# Patient Record
Sex: Female | Born: 1955 | Race: White | Hispanic: No | Marital: Single | Smoking: Never smoker
Health system: Southern US, Community
[De-identification: ages and names within clinical notes are randomized; demographics above are authoritative.]

---

## 2005-01-13 ENCOUNTER — Other Ambulatory Visit: Admission: RE | Admit: 2005-01-13 | Discharge: 2005-01-13 | Payer: Self-pay | Admitting: Family Medicine

## 2005-03-01 ENCOUNTER — Ambulatory Visit (HOSPITAL_COMMUNITY): Admission: RE | Admit: 2005-03-01 | Discharge: 2005-03-01 | Payer: Self-pay | Admitting: Gastroenterology

## 2005-03-01 ENCOUNTER — Encounter (INDEPENDENT_AMBULATORY_CARE_PROVIDER_SITE_OTHER): Payer: Self-pay | Admitting: Specialist

## 2006-03-01 ENCOUNTER — Other Ambulatory Visit: Admission: RE | Admit: 2006-03-01 | Discharge: 2006-03-01 | Payer: Self-pay | Admitting: Family Medicine

## 2008-06-24 ENCOUNTER — Other Ambulatory Visit: Admission: RE | Admit: 2008-06-24 | Discharge: 2008-06-24 | Payer: Self-pay | Admitting: Family Medicine

## 2008-12-30 ENCOUNTER — Other Ambulatory Visit: Admission: RE | Admit: 2008-12-30 | Discharge: 2008-12-30 | Payer: Self-pay | Admitting: Family Medicine

## 2010-06-17 ENCOUNTER — Encounter: Admission: RE | Admit: 2010-06-17 | Discharge: 2010-06-17 | Payer: Self-pay | Admitting: Family Medicine

## 2011-02-26 NOTE — Op Note (Signed)
NAMEASPIN, PALOMAREZ NO.:  [id number]   MEDICAL RECORD NO.:  [id number]          PATIENT TYPE:  AMB   LOCATION:  ENDO                         FACILITY:  [facility]   PHYSICIAN:  Graylin Shiver, M.D.   DATE OF BIRTH:  August 24, 1956   DATE OF PROCEDURE:  03/01/2005  DATE OF DISCHARGE:                                 OPERATIVE REPORT   PROCEDURES:  Colonoscopy with biopsy.   INDICATION:  Rectal bleeding and family history of colon polyps.   Informed consent was obtained after the explanation of the risks of  bleeding, infection and perforation.   PREMEDICATIONS:  Fentanyl 100 mcg IV and Versed 10 mg IV.   DESCRIPTION OF PROCEDURE:  With the patient in the left lateral decubitus  position, a rectal exam was performed.  No masses were felt.  The Olympus  colonoscope was inserted into the rectum and advanced around the colon to  the cecum.  Cecal landmarks were identified.  The cecum looked normal.  In  the ascending colon there was a small 3 mm sessile polyp biopsied off with  cold forceps.  The transverse colon looked normal.  The descending colon,  sigmoid and rectum looked normal.  There were some small internal  hemorrhoids.  She tolerated the procedure well without complications.   IMPRESSION:  1.  Small ascending colon polyp.  2.  Small internal hemorrhoids.   PLAN:  The pathology will be checked.      SFG/MEDQ  D:  03/01/2005  T:  03/01/2005  Job:  [id number]   cc:   Holley Bouche, M.D.  [address]  [place], [place] [address]  Fax: [phone]

## 2011-05-21 ENCOUNTER — Other Ambulatory Visit: Payer: Self-pay | Admitting: Family Medicine

## 2011-05-21 DIAGNOSIS — M542 Cervicalgia: Secondary | ICD-10-CM

## 2011-05-22 ENCOUNTER — Ambulatory Visit
Admission: RE | Admit: 2011-05-22 | Discharge: 2011-05-22 | Disposition: A | Discharge status: Home / Self Care | Payer: 59 | Source: Ambulatory Visit | Attending: Family Medicine | Admitting: Family Medicine

## 2011-05-22 DIAGNOSIS — M542 Cervicalgia: Secondary | ICD-10-CM

## 2011-06-04 ENCOUNTER — Encounter (HOSPITAL_COMMUNITY)
Admission: RE | Admit: 2011-06-04 | Discharge: 2011-06-04 | Disposition: A | Discharge status: Home / Self Care | Payer: 59 | Source: Ambulatory Visit | Attending: Neurological Surgery | Admitting: Neurological Surgery

## 2011-06-04 ENCOUNTER — Ambulatory Visit (HOSPITAL_COMMUNITY)
Admission: RE | Admit: 2011-06-04 | Discharge: 2011-06-04 | Disposition: A | Discharge status: Home / Self Care | Payer: 59 | Source: Ambulatory Visit | Attending: Neurological Surgery | Admitting: Neurological Surgery

## 2011-06-04 ENCOUNTER — Other Ambulatory Visit (HOSPITAL_COMMUNITY): Payer: Self-pay | Admitting: Neurological Surgery

## 2011-06-04 DIAGNOSIS — M5000 Cervical disc disorder with myelopathy, unspecified cervical region: Secondary | ICD-10-CM

## 2011-06-04 DIAGNOSIS — I10 Essential (primary) hypertension: Secondary | ICD-10-CM | POA: Insufficient documentation

## 2011-06-04 DIAGNOSIS — Z01812 Encounter for preprocedural laboratory examination: Secondary | ICD-10-CM | POA: Insufficient documentation

## 2011-06-04 DIAGNOSIS — M4802 Spinal stenosis, cervical region: Secondary | ICD-10-CM | POA: Insufficient documentation

## 2011-06-04 DIAGNOSIS — Z01810 Encounter for preprocedural cardiovascular examination: Secondary | ICD-10-CM | POA: Insufficient documentation

## 2011-06-04 DIAGNOSIS — Z01818 Encounter for other preprocedural examination: Secondary | ICD-10-CM | POA: Insufficient documentation

## 2011-06-04 LAB — BASIC METABOLIC PANEL
BUN: 16 mg/dL (ref 6–23)
CO2: 27 mEq/L (ref 19–32)
Calcium: 9.8 mg/dL (ref 8.4–10.5)
Chloride: 107 mEq/L (ref 96–112)
Creatinine, Ser: 0.85 mg/dL (ref 0.50–1.10)
GFR calc Af Amer: >60 mL/min (ref >60)
GFR calc non Af Amer: >60 mL/min (ref >60)
Glucose, Bld: 145 mg/dL — ABNORMAL HIGH (ref 70–99)
Potassium: 5.5 mEq/L — ABNORMAL HIGH (ref 3.5–5.1)
Sodium: 142 mEq/L (ref 135–145)

## 2011-06-04 LAB — SURGICAL PCR SCREEN
MRSA, PCR: NEGATIVE
Staphylococcus aureus: NEGATIVE

## 2011-06-04 LAB — CBC
HCT: 42.7 % (ref 36.0–46.0)
Hemoglobin: 14.3 g/dL (ref 12.0–15.0)
MCH: 29.4 pg (ref 26.0–34.0)
MCHC: 33.5 g/dL (ref 30.0–36.0)
MCV: 87.9 fL (ref 78.0–100.0)
Platelets: 213 10*3/uL (ref 150–400)
RBC: 4.86 MIL/uL (ref 3.87–5.11)
RDW: 14 % (ref 11.5–15.5)
WBC: 10.7 10*3/uL — ABNORMAL HIGH (ref 4.0–10.5)

## 2011-06-08 ENCOUNTER — Ambulatory Visit (HOSPITAL_COMMUNITY)
Admission: RE | Admit: 2011-06-08 | Discharge: 2011-06-09 | Disposition: A | Discharge status: Home / Self Care | Payer: 59 | Source: Ambulatory Visit | Attending: Neurological Surgery | Admitting: Neurological Surgery

## 2011-06-08 ENCOUNTER — Ambulatory Visit (HOSPITAL_COMMUNITY): Payer: 59

## 2011-06-08 DIAGNOSIS — Z01818 Encounter for other preprocedural examination: Secondary | ICD-10-CM | POA: Insufficient documentation

## 2011-06-08 DIAGNOSIS — M26609 Unspecified temporomandibular joint disorder, unspecified side: Secondary | ICD-10-CM | POA: Insufficient documentation

## 2011-06-08 DIAGNOSIS — Z01812 Encounter for preprocedural laboratory examination: Secondary | ICD-10-CM | POA: Insufficient documentation

## 2011-06-08 DIAGNOSIS — I10 Essential (primary) hypertension: Secondary | ICD-10-CM | POA: Insufficient documentation

## 2011-06-08 DIAGNOSIS — M5000 Cervical disc disorder with myelopathy, unspecified cervical region: Secondary | ICD-10-CM | POA: Insufficient documentation

## 2011-06-08 DIAGNOSIS — Z01810 Encounter for preprocedural cardiovascular examination: Secondary | ICD-10-CM | POA: Insufficient documentation

## 2011-06-08 LAB — BASIC METABOLIC PANEL
BUN: 9 mg/dL (ref 6–23)
CO2: 24 mEq/L (ref 19–32)
Calcium: 9.2 mg/dL (ref 8.4–10.5)
Chloride: 106 mEq/L (ref 96–112)
Creatinine, Ser: 0.72 mg/dL (ref 0.50–1.10)
GFR calc Af Amer: >60 mL/min (ref >60)
GFR calc non Af Amer: >60 mL/min (ref >60)
Glucose, Bld: 113 mg/dL — ABNORMAL HIGH (ref 70–99)
Potassium: 4 mEq/L (ref 3.5–5.1)
Sodium: 140 mEq/L (ref 135–145)

## 2011-06-17 NOTE — Op Note (Signed)
Sheri Adams, Sheri Adams NO.:  [id number]  MEDICAL RECORD NO.:  [id number]  LOCATION:  3528                         FACILITY:  [facility]  PHYSICIAN:  Stefani Dama, M.D.  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  PROCEDURE:  Anterior cervical decompression and arthrodesis with structural allograft C5-6 and C6-7, anterior plate fixation with Alphatec plate using a new style Trestle plate.  SURGEON:  Stefani Dama, MD  FIRST ASSISTANT:  Hilda Lias, MD  ANESTHESIA:  General endotracheal.  INDICATIONS:  Sheri Adams is a 55 year old individual who has had neck pain, shoulder and arm pain with weakness in the right upper extremity.  She has evidence of C6 and C7 radicular changes on the right side and she was advised to have an MRI which demonstrated presence of cord compression and severe spondylitic disease at C5-C6.  The patient was taken to the operating room at this time to undergo surgical decompression of the 2 levels involved C5-6 and C6-7 and she has had significant weakness in the right arm.  The patient was brought to the operating supine on the stretcher.  After smooth induction of general endotracheal anesthesia, she was placed in a horseshoe headrest and neck was prepped with alcohol and DuraPrep and draped in sterile fashion.  A transverse incision was created on the left side of the neck and this was carried down to the platysma.  The plane between the sternocleidomastoid and strap muscles were dissected bluntly until the first prevertebral space was reached.  This was identified positively at C5-C6  on localizing radiograph.  The longus colli muscle on either side of the area between C5-6 and C6-7 was then stripped and a self-retaining Caspar retractor was placed in the wound. C5-6 was first opened with 15 blade, used to open the ventral aspect of the disk space and the interdiskal space was cleaned of significant quantity of severely degenerating desiccated disk material.  There was a moderate amount of uncinate hypertrophy that was encountered and this was taken down with a high-speed drill.  Osteophytectomy of the uncinate spurs was then performed and this opened the C6 nerve root foramen and osteophyte from the inferior margin of body of C5 was similarly drilled down and the posterior longitudinal ligament was opened with a 2-mm Kerrison punch relieving significantly the central canal stenosis. Lateral recesses were then opened in a similar fashion removing the posterior longitudinal ligament far out into the lateral recesses.  Once an adequate decompression was achieved,  the endplates were smoothed with a 4-mm barrel bit.  A 7-mm transgraft was then shaved to the appropriate size and configuration, filled with demineralized bone matrix, and placed into the interspace in appropriate depth.  Then, attention was turned to C6-C7 where similar procedure was carried out. Here, the central canal was thickened but not nearly as thick as it was at C5-C6.  The central canal and lateral recesses were decompressed in a similar fashion.  Osteophytosis was encountered that was not nearly as severe as it was at C5-C6.  Once the nerve roots were well decompressed, hemostasis was established.  Another 7-mm transgraft was shaved to the appropriate size and configuration. Filled with demineralized bone matrix, and placed into the interspace.  At this point, a 32-mm standard size Alphatec plate of the Trestle variety was placed on the ventral aspect of the vertebral bodies and secured with 6  locking 4 x 12-mm screws.  After this was accomplished, the area was irrigated copiously with antibiotic-irrigating solution.  Hemostasis was meticulously obtained in this area and the platysma was closed with 3-0 Vicryl in inverted interrupted fashion, 3-0 Vicryl using subcuticular tissues, and Dermabond was placed on the skin.  Final radiograph that was obtained prior to final closure revealed good position of the surgical construct.     Stefani Dama, M.D.     [name]  D:  06/08/2011  T:  06/08/2011  Job:  [id number]  Electronically Signed by Barnett Abu M.D. on 06/17/2011 03:16:59 PM

## 2012-01-24 ENCOUNTER — Other Ambulatory Visit: Payer: Self-pay | Admitting: Family Medicine

## 2012-01-24 ENCOUNTER — Other Ambulatory Visit (HOSPITAL_COMMUNITY)
Admission: RE | Admit: 2012-01-24 | Discharge: 2012-01-24 | Disposition: A | Discharge status: Home / Self Care | Payer: 59 | Source: Ambulatory Visit | Attending: Family Medicine | Admitting: Family Medicine

## 2012-01-24 DIAGNOSIS — Z0000 Encounter for general adult medical examination without abnormal findings: Secondary | ICD-10-CM | POA: Insufficient documentation

## 2012-09-21 IMAGING — CR DG CHEST 2V
2 series · 2 of 2 positions shown · non-contrast
Comparison: None.

CLINICAL DATA: Preop for cervical spine laminectomy, hypertension

CHEST - 2 VIEW

[view not recorded (1 of 2)]
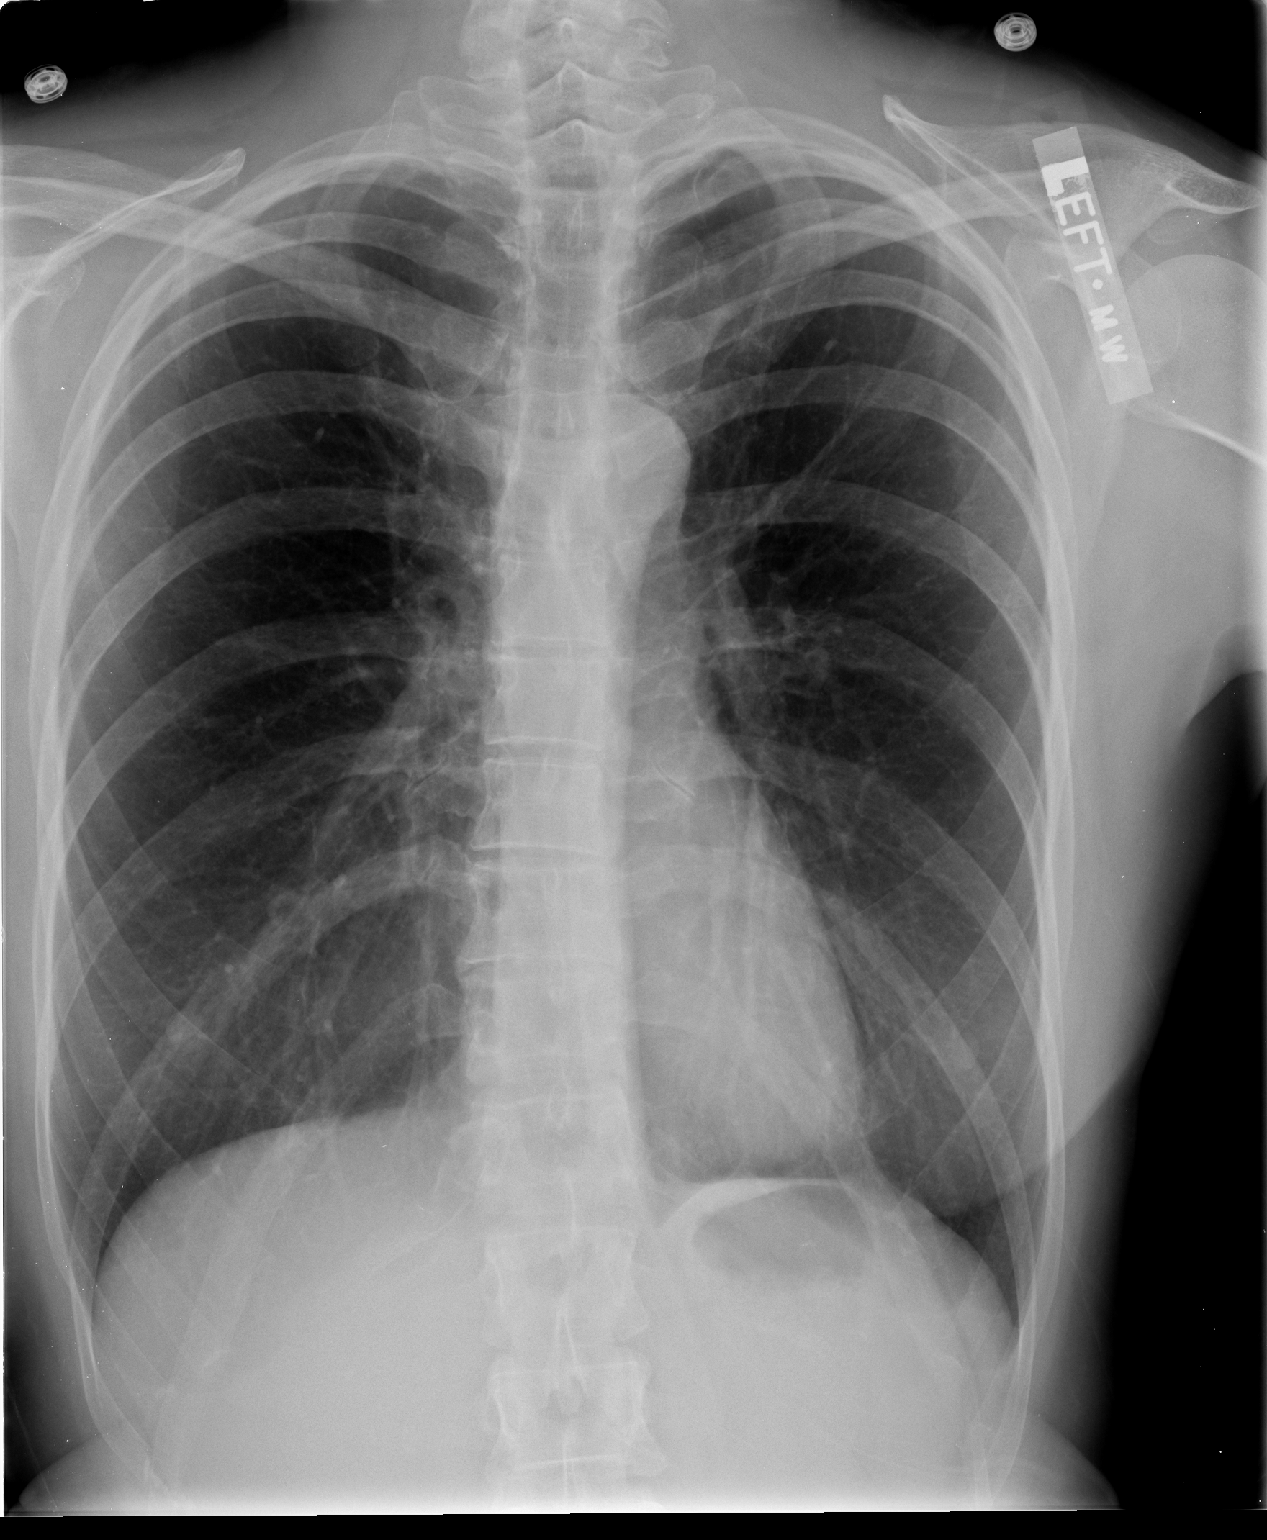

[view not recorded (2 of 2)]
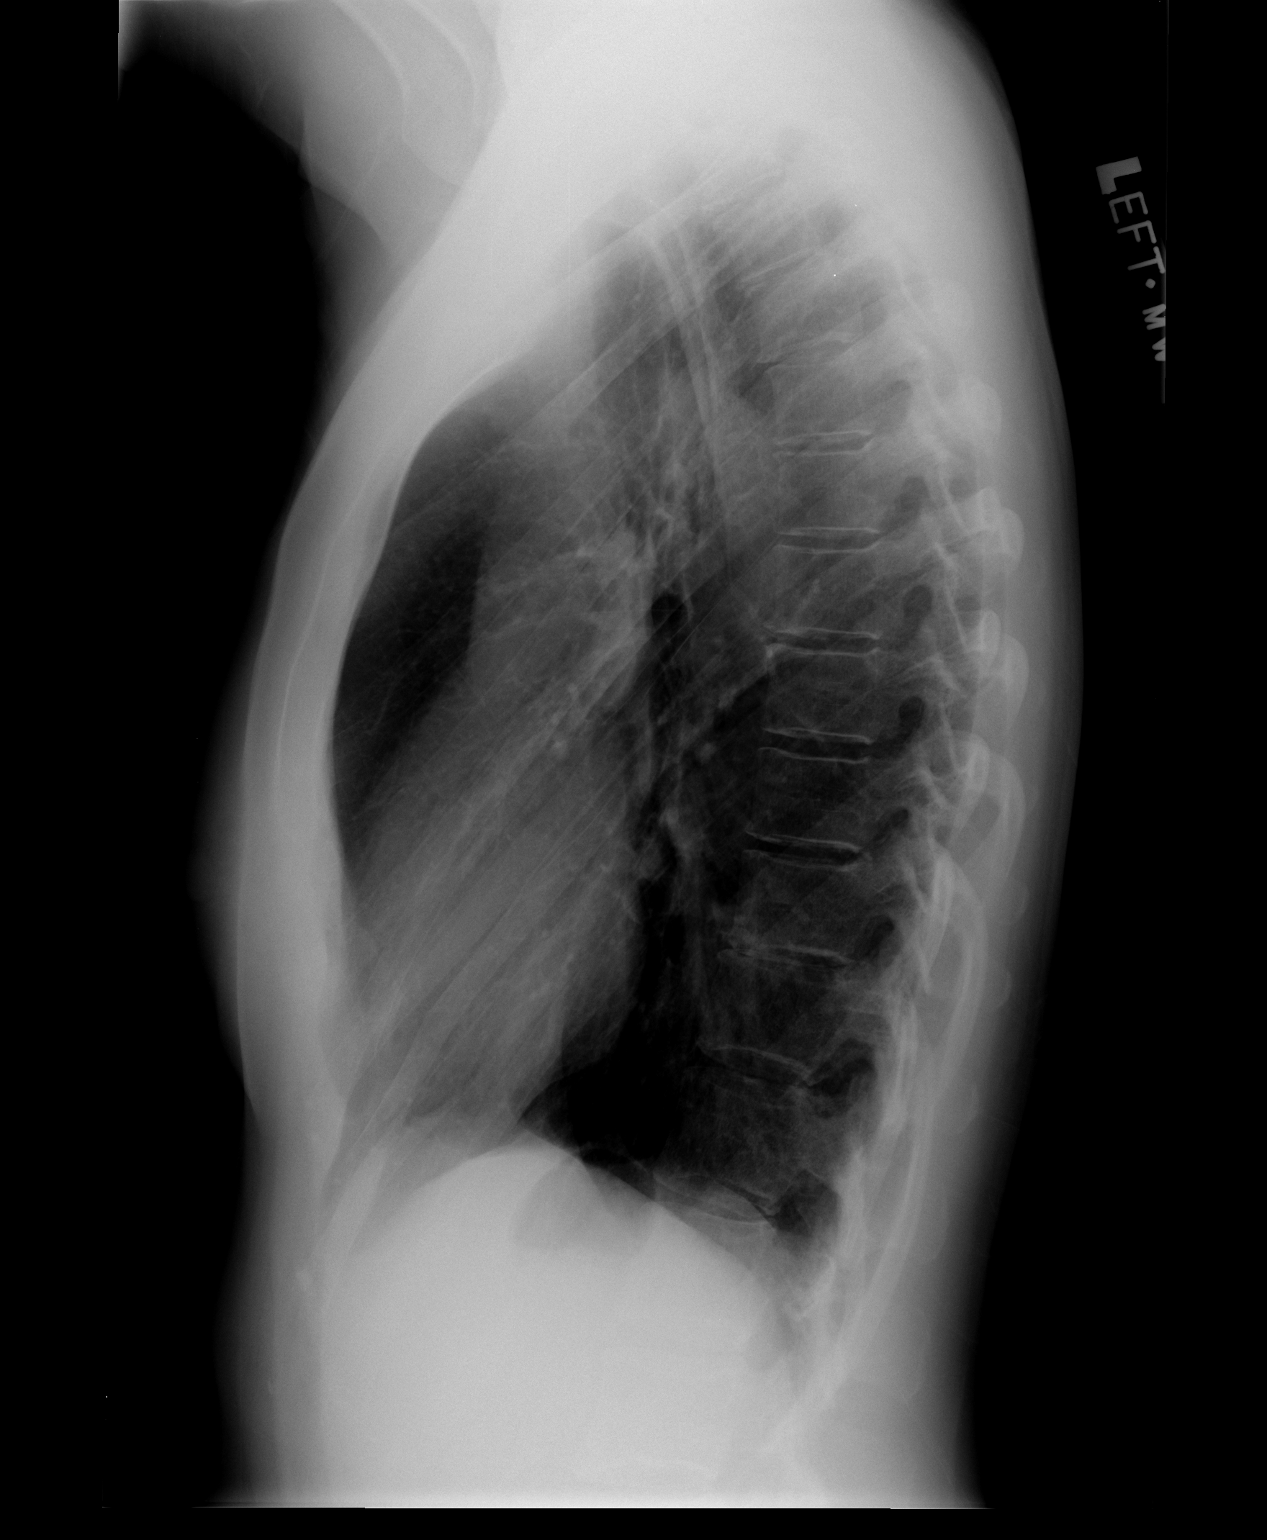

[2 of 2 positions shown; findings below may reference images not displayed]

FINDINGS: The lungs are clear and slightly hyperaerated.
Mediastinal contours appear normal.  The heart is within normal
limits in size.  No bony abnormality is seen.  Nipple shadows are
noted symmetrically at both lung bases.
IMPRESSION: Hyperaeration.  No active lung disease.

## 2013-06-28 ENCOUNTER — Ambulatory Visit
Admission: RE | Admit: 2013-06-28 | Discharge: 2013-06-28 | Disposition: A | Discharge status: Home / Self Care | Payer: 59 | Source: Ambulatory Visit | Attending: Family Medicine | Admitting: Family Medicine

## 2013-06-28 ENCOUNTER — Other Ambulatory Visit: Payer: Self-pay | Admitting: Family Medicine

## 2013-06-28 DIAGNOSIS — Z0000 Encounter for general adult medical examination without abnormal findings: Secondary | ICD-10-CM

## 2014-12-27 ENCOUNTER — Other Ambulatory Visit: Payer: Self-pay | Admitting: Family Medicine

## 2014-12-27 ENCOUNTER — Other Ambulatory Visit (HOSPITAL_COMMUNITY)
Admission: RE | Admit: 2014-12-27 | Discharge: 2014-12-27 | Disposition: A | Discharge status: Home / Self Care | Payer: 59 | Source: Ambulatory Visit | Attending: Family Medicine | Admitting: Family Medicine

## 2014-12-27 DIAGNOSIS — Z01419 Encounter for gynecological examination (general) (routine) without abnormal findings: Secondary | ICD-10-CM | POA: Insufficient documentation

## 2015-01-02 LAB — CYTOLOGY - PAP

## 2016-01-02 ENCOUNTER — Other Ambulatory Visit: Payer: Self-pay | Admitting: Family Medicine

## 2016-01-02 ENCOUNTER — Other Ambulatory Visit (HOSPITAL_COMMUNITY)
Admission: RE | Admit: 2016-01-02 | Discharge: 2016-01-02 | Disposition: A | Discharge status: Home / Self Care | Payer: 59 | Source: Ambulatory Visit | Attending: Family Medicine | Admitting: Family Medicine

## 2016-01-02 DIAGNOSIS — Z1151 Encounter for screening for human papillomavirus (HPV): Secondary | ICD-10-CM | POA: Diagnosis not present

## 2016-01-02 DIAGNOSIS — Z01419 Encounter for gynecological examination (general) (routine) without abnormal findings: Secondary | ICD-10-CM | POA: Diagnosis present

## 2016-01-06 LAB — CYTOLOGY - PAP

## 2017-01-05 ENCOUNTER — Ambulatory Visit
Admission: RE | Admit: 2017-01-05 | Discharge: 2017-01-05 | Disposition: A | Discharge status: Home / Self Care | Payer: 59 | Source: Ambulatory Visit | Attending: Family Medicine | Admitting: Family Medicine

## 2017-01-05 ENCOUNTER — Other Ambulatory Visit: Payer: Self-pay | Admitting: Family Medicine

## 2017-01-05 DIAGNOSIS — R059 Cough, unspecified: Secondary | ICD-10-CM

## 2017-01-05 DIAGNOSIS — R05 Cough: Secondary | ICD-10-CM

## 2018-02-17 ENCOUNTER — Other Ambulatory Visit: Payer: Self-pay | Admitting: Family Medicine

## 2018-02-17 DIAGNOSIS — R1011 Right upper quadrant pain: Secondary | ICD-10-CM

## 2018-02-23 ENCOUNTER — Other Ambulatory Visit: Payer: 59

## 2018-03-13 ENCOUNTER — Other Ambulatory Visit: Payer: Self-pay | Admitting: Family Medicine

## 2018-03-13 DIAGNOSIS — R1011 Right upper quadrant pain: Secondary | ICD-10-CM

## 2018-03-21 ENCOUNTER — Other Ambulatory Visit: Payer: Self-pay

## 2019-06-06 ENCOUNTER — Other Ambulatory Visit: Payer: Self-pay | Admitting: Family Medicine

## 2019-06-06 DIAGNOSIS — R1011 Right upper quadrant pain: Secondary | ICD-10-CM

## 2020-01-17 ENCOUNTER — Ambulatory Visit: Payer: Self-pay | Attending: Internal Medicine

## 2020-01-17 DIAGNOSIS — Z23 Encounter for immunization: Secondary | ICD-10-CM

## 2020-01-17 NOTE — Progress Notes (Signed)
   Covid-19 Vaccination Clinic  Name:  Sheri Adams    MRN: [id number] DOB: 1956/01/13  01/17/2020  [name] was observed post Covid-19 immunization for 15 minutes without incident. She was provided with Vaccine Information Sheet and instruction to access the V-Safe system.   [name] was instructed to call 911 with any severe reactions post vaccine: Marland Kitchen Difficulty breathing  . Swelling of face and throat  . A fast heartbeat  . A bad rash all over body  . Dizziness and weakness   Immunizations Administered    Name Date Dose VIS Date Route   Pfizer COVID-19 Vaccine 01/17/2020 12:03 PM 0.3 mL 09/21/2019 Intramuscular   Manufacturer: [employer], [employer]   Lot: [id number]   NDC: [address]-5

## 2020-01-22 ENCOUNTER — Other Ambulatory Visit: Payer: Self-pay | Admitting: Family Medicine

## 2020-01-22 DIAGNOSIS — M5412 Radiculopathy, cervical region: Secondary | ICD-10-CM

## 2020-01-22 DIAGNOSIS — M542 Cervicalgia: Secondary | ICD-10-CM

## 2020-02-05 ENCOUNTER — Other Ambulatory Visit: Payer: Self-pay | Admitting: Family Medicine

## 2020-02-05 DIAGNOSIS — M542 Cervicalgia: Secondary | ICD-10-CM

## 2020-02-05 DIAGNOSIS — M5412 Radiculopathy, cervical region: Secondary | ICD-10-CM

## 2020-02-11 ENCOUNTER — Ambulatory Visit: Payer: Managed Care, Other (non HMO) | Attending: Internal Medicine

## 2020-02-11 DIAGNOSIS — Z23 Encounter for immunization: Secondary | ICD-10-CM

## 2020-02-11 NOTE — Progress Notes (Signed)
   Covid-19 Vaccination Clinic  Name:  Sheri Adams    MRN: [id number] DOB: 12/04/1955  02/11/2020  Sheri Adams was observed post Covid-19 immunization for 30 minutes based on pre-vaccination screening without incident. She was provided with Vaccine Information Sheet and instruction to access the V-Safe system.   Sheri Adams was instructed to call 911 with any severe reactions post vaccine: Marland Kitchen Difficulty breathing  . Swelling of face and throat  . A fast heartbeat  . A bad rash all over body  . Dizziness and weakness   Immunizations Administered    Name Date Dose VIS Date Route   Pfizer COVID-19 Vaccine 02/11/2020  9:23 AM 0.3 mL 12/05/2018 Intramuscular   Manufacturer: [employer], [employer]   Lot: [id number]   NDC: [address]-7

## 2020-02-18 ENCOUNTER — Other Ambulatory Visit: Payer: Self-pay

## 2020-02-18 ENCOUNTER — Ambulatory Visit: Payer: Managed Care, Other (non HMO) | Admitting: Cardiology

## 2020-02-18 VITALS — BP 130/90 | HR 84 | Temp 97.0°F | Ht 71.0 in | Wt 167.0 lb

## 2020-02-18 DIAGNOSIS — Z7189 Other specified counseling: Secondary | ICD-10-CM | POA: Diagnosis not present

## 2020-02-18 DIAGNOSIS — R002 Palpitations: Secondary | ICD-10-CM

## 2020-02-18 DIAGNOSIS — Z8249 Family history of ischemic heart disease and other diseases of the circulatory system: Secondary | ICD-10-CM | POA: Diagnosis not present

## 2020-02-18 DIAGNOSIS — E7800 Pure hypercholesterolemia, unspecified: Secondary | ICD-10-CM

## 2020-02-18 NOTE — Progress Notes (Signed)
Cardiology Office Note:    Date:  02/18/2020   ID:  Sheri Adams, DOB [birth year], MRN [id number]  PCP:  Shirline Frees, MD  Cardiologist:  Buford Dresser, MD  Referring MD: Lois Huxley, PA   CC: new patient consult for palpitations  History of Present Illness:    Sheri Adams is a 64 y.o. female with a hx of anxiety, insomnia, neck pain, hypercholesterolemia who is seen as a new consult at the request of Lois Huxley, [place] for the evaluation and management of palpitations.  Note reviewed from Marilynne Drivers, [place] from 01/18/20. Note made of possible atrial flutter at office visit, unfortunately I am unable to see ECG. Referred to cardiology for further evaluation of her palpitations  Palpitations: -Initial onset: Dec 2020-Jan 2021 noted to be more regular. -Frequency/Duration: at its peak, felt about hourly with very brief episodes. Much less common recently. -Associated symptoms: none -Aggravating/alleviating factors: none -Syncope/near syncope: none -Prior cardiac history: none that she knows of -Prior ECG: prior reported as possible flutter, but I do not have access to this ECG -Prior workup: none -Prior treatment: none -Caffeine: 1 cup of coffee/day -Alcohol: none recently with her neck pain, had been a few glasses of wine/week prior -Tobacco: never -OTC supplements: none -Exercise level: Walks neighborhood regularly without issue -Labs: TSH, kidney function/electrolytes, CBC reviewed. -Cardiac ROS: no chest pain, no shortness of breath, no PND, no orthopnea, no LE edema. -Family history: father died age 61, assumed to be a heart attack. Had prior angioplasties. Mother had high blood pressure. Several siblings with high blood pressure.   Has a history of cervical spine disease, severe pain 06/2019 that was causing her anxiety at the time. Now has been evaluated by neurosurgery.  ROS three month long rash 10/2018 of unclear etiology, no clear triggers,  resolved on its own. Also reports intermittent toe discoloration, being follow by her PCP.  Past Medical History:  Diagnosis Date  . Palpitations 04/08/2020    History reviewed. No pertinent surgical history.  Current Medications: Current Outpatient Medications on File Prior to Visit  Medication Sig  . zolpidem (AMBIEN) 10 MG tablet Take 10 mg by mouth at bedtime as needed for sleep. Take 1/2 table at bedtime as needed  . ciprofloxacin (CIPRO) 250 MG tablet Take 250 mg by mouth 2 (two) times daily.  . citalopram (CELEXA) 10 MG tablet   . diazepam (VALIUM) 2 MG tablet Take 2 mg by mouth daily.   No current facility-administered medications on file prior to visit.     Allergies:   Bactrim [sulfamethoxazole-trimethoprim] and Penicillins   Social History   Tobacco Use  . Smoking status: Never Smoker  . Smokeless tobacco: Never Used  Substance Use Topics  . Alcohol use: Not Currently  . Drug use: Not on file    Family History: family history includes CAD in her father; High blood pressure in her mother.  ROS:   Please see the history of present illness.  Additional pertinent ROS: Constitutional: Negative for chills, fever, night sweats, unintentional weight loss  HENT: Negative for ear pain and hearing loss.   Eyes: Negative for loss of vision and eye pain.  Respiratory: Negative for cough, sputum, wheezing.   Cardiovascular: See HPI. Gastrointestinal: Negative for abdominal pain, melena, and hematochezia.  Genitourinary: Negative for dysuria and hematuria.  Musculoskeletal: Negative for falls and myalgias.  Skin: Negative for itching, positive for prior rash. Neurological: Negative for focal weakness, focal sensory changes and loss of  consciousness.  Endo/Heme/Allergies: Does not bruise/bleed easily.     EKGs/Labs/Other Studies Reviewed:    The following studies were reviewed today: No prior cardiac studies  EKG:  EKG is personally reviewed.  The ekg ordered today  demonstrates NSR at 84 bpm  Recent Labs: No results found for requested labs within last 8760 hours.  Recent Lipid Panel No results found for: CHOL, TRIG, HDL, CHOLHDL, VLDL, LDLCALC, LDLDIRECT  Physical Exam:    VS:  BP 130/90   Pulse 84   Temp (!) 97 F (36.1 C)   Ht 5\' 11"  (1.803 m)   Wt 167 lb (75.8 kg)   SpO2 98%   BMI 23.29 kg/m     Wt Readings from Last 3 Encounters:  02/18/20 167 lb (75.8 kg)    GEN: Well nourished, well developed in no acute distress HEENT: Normal, moist mucous membranes NECK: No JVD CARDIAC: regular rhythm, normal S1 and S2, no rubs or gallops. No murmurs. VASCULAR: Radial and DP pulses 2+ bilaterally. No carotid bruits RESPIRATORY:  Clear to auscultation without rales, wheezing or rhonchi  ABDOMEN: Soft, non-tender, non-distended MUSCULOSKELETAL:  Ambulates independently SKIN: Warm and dry, no edema NEUROLOGIC:  Alert and oriented x 3. No focal neuro deficits noted. PSYCHIATRIC:  Normal affect    ASSESSMENT:    1. Palpitation   2. Pure hypercholesterolemia   3. Family history of heart disease   4. Cardiac risk counseling   5. Counseling on health promotion and disease prevention    PLAN:    Palpitations: Has significantly diminished in frequency more recently. ECG today normal sinus rhythm. I cannot see ECG concerning for flutter from PCP office.  -we discussed monitors (telemetry vs. Kardia), pros/cons. As she has recently had minimal events, will hold on monitor for now -instructed on red flag warning signs that need immediate medical attention -will contact 04/19/20 if symptoms become more frequent, then would pursue monitor.  Hypercholesterolemia:  per [facility] 06/05/19: Tchol 238, HDL 56, LDL 165, TG 85.  -with family history of heart disease in her father, we discussed further risk stratification vs. Primary prevention with statin. Declines at this time.  Cardiac risk counseling and prevention recommendations: -recommend heart  healthy/Mediterranean diet, with whole grains, fruits, vegetable, fish, lean meats, nuts, and olive oil. Limit salt. -recommend moderate walking, 3-5 times/week for 30-50 minutes each session. Aim for at least 150 minutes.week. Goal should be pace of 3 miles/hours, or walking 1.5 miles in 30 minutes -recommend avoidance of tobacco products. Avoid excess alcohol. -Additional risk factor control:  -Diabetes risk: A1c is 5.8 per [facility]  -Blood pressure control: at goal on no medications  -Weight: BMI 23  Plan for follow up: as needed  06/07/19, MD, PhD [place]  [facility]    Medication Adjustments/Labs and Tests Ordered: Current medicines are reviewed at length with the patient today.  Concerns regarding medicines are outlined above.  Orders Placed This Encounter  Procedures  . EKG 12-Lead   No orders of the defined types were placed in this encounter.   Patient Instructions  Medication Instructions:  Your Physician recommend you continue on your current medication as directed.    *If you need a refill on your cardiac medications before your next appointment, please call your pharmacy*   Lab Work: None    Testing/Procedures: None   Follow-Up: At [facility], you and your health needs are our priority.  As part of our continuing mission to provide you with exceptional heart care,  we have created designated Provider Care Teams.  These Care Teams include your primary Cardiologist (physician) and Advanced Practice Providers (APPs -  Physician Assistants and Nurse Practitioners) who all work together to provide you with the care you need, when you need it.  We recommend signing up for the patient portal called "MyChart".  Sign up information is provided on this After Visit Summary.  MyChart is used to connect with patients for Virtual Visits (Telemedicine).  Patients are able to view lab/test results, encounter notes, upcoming appointments, etc.  Non-urgent  messages can be sent to your provider as well.   To learn more about what you can do with MyChart, go to [email or web address].    Your next appointment:   As needed  The format for your next appointment:   Either In Person or Virtual  Provider:   Jodelle Red, MD       Signed, Jodelle Red, MD PhD 02/18/2020     [facility]

## 2020-02-18 NOTE — Patient Instructions (Signed)
Medication Instructions:  Your Physician recommend you continue on your current medication as directed.    *If you need a refill on your cardiac medications before your next appointment, please call your pharmacy*   Lab Work: None   Testing/Procedures: None   Follow-Up: At [facility], you and your health needs are our priority.  As part of our continuing mission to provide you with exceptional heart care, we have created designated Provider Care Teams.  These Care Teams include your primary Cardiologist (physician) and Advanced Practice Providers (APPs -  Physician Assistants and Nurse Practitioners) who all work together to provide you with the care you need, when you need it.  We recommend signing up for the patient portal called "MyChart".  Sign up information is provided on this After Visit Summary.  MyChart is used to connect with patients for Virtual Visits (Telemedicine).  Patients are able to view lab/test results, encounter notes, upcoming appointments, etc.  Non-urgent messages can be sent to your provider as well.   To learn more about what you can do with MyChart, go to [email or web address].    Your next appointment:   As needed  The format for your next appointment:   Either In Person or Virtual  Provider:   Bridgette Christopher, MD   

## 2020-04-08 ENCOUNTER — Encounter: Payer: Self-pay | Admitting: Cardiology

## 2020-04-08 DIAGNOSIS — E7800 Pure hypercholesterolemia, unspecified: Secondary | ICD-10-CM | POA: Insufficient documentation

## 2020-04-08 DIAGNOSIS — R002 Palpitations: Secondary | ICD-10-CM

## 2020-04-08 DIAGNOSIS — F419 Anxiety disorder, unspecified: Secondary | ICD-10-CM

## 2020-04-08 HISTORY — DX: Pure hypercholesterolemia, unspecified: E78.00

## 2020-04-08 HISTORY — DX: Anxiety disorder, unspecified: F41.9

## 2020-04-08 HISTORY — DX: Palpitations: R00.2

## 2021-05-14 ENCOUNTER — Ambulatory Visit
Admission: RE | Admit: 2021-05-14 | Discharge: 2021-05-14 | Disposition: A | Discharge status: Home / Self Care | Payer: Managed Care, Other (non HMO) | Source: Ambulatory Visit | Attending: Family Medicine | Admitting: Family Medicine

## 2021-05-14 ENCOUNTER — Other Ambulatory Visit: Payer: Self-pay | Admitting: Family Medicine

## 2021-05-14 DIAGNOSIS — M25551 Pain in right hip: Secondary | ICD-10-CM

## 2021-12-09 ENCOUNTER — Other Ambulatory Visit: Payer: Self-pay | Admitting: Physician Assistant

## 2021-12-09 DIAGNOSIS — R1031 Right lower quadrant pain: Secondary | ICD-10-CM

## 2021-12-18 ENCOUNTER — Ambulatory Visit
Admission: RE | Admit: 2021-12-18 | Discharge: 2021-12-18 | Disposition: A | Discharge status: Home / Self Care | Payer: Managed Care, Other (non HMO) | Source: Ambulatory Visit | Attending: Physician Assistant | Admitting: Physician Assistant

## 2021-12-18 DIAGNOSIS — R1031 Right lower quadrant pain: Secondary | ICD-10-CM

## 2021-12-18 MED ORDER — IOPAMIDOL (ISOVUE-300) INJECTION 61%
100.0000 mL | Freq: Once | INTRAVENOUS | Status: AC | PRN
  Administered 2021-12-18: 100 mL via INTRAVENOUS

## 2022-09-01 IMAGING — CR DG LUMBAR SPINE COMPLETE 4+V
5 series · 5 of 5 positions shown · non-contrast
Comparison: None

CLINICAL DATA: RIGHT hip and low back pain, no known injury

EXAM:
LUMBAR SPINE - COMPLETE 4+ VIEW

[t l-spine a.p.]
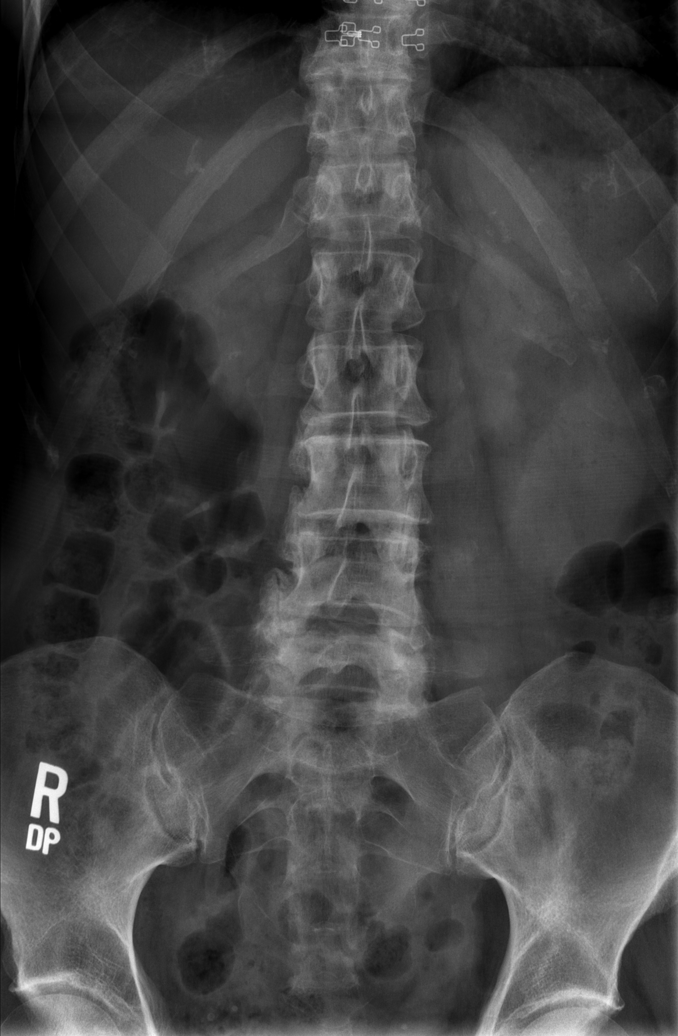

[t l-spine oblique exposure (1 of 2)]
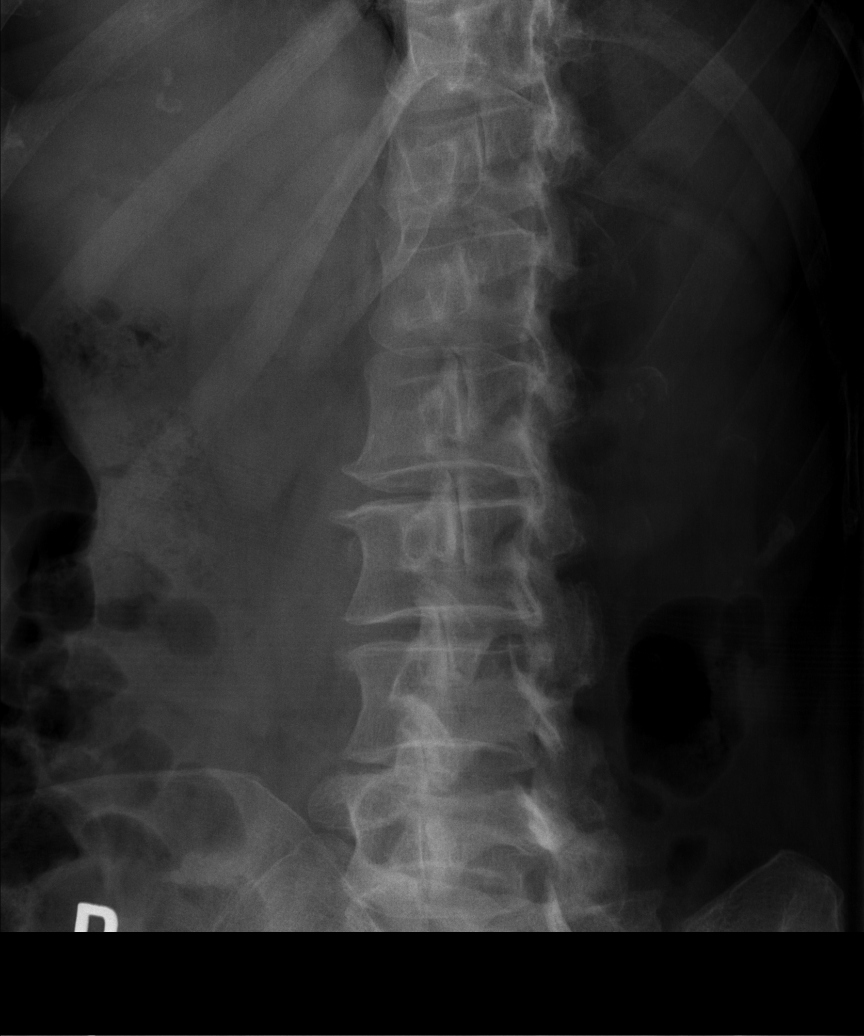

[t l-spine oblique exposure (2 of 2)]
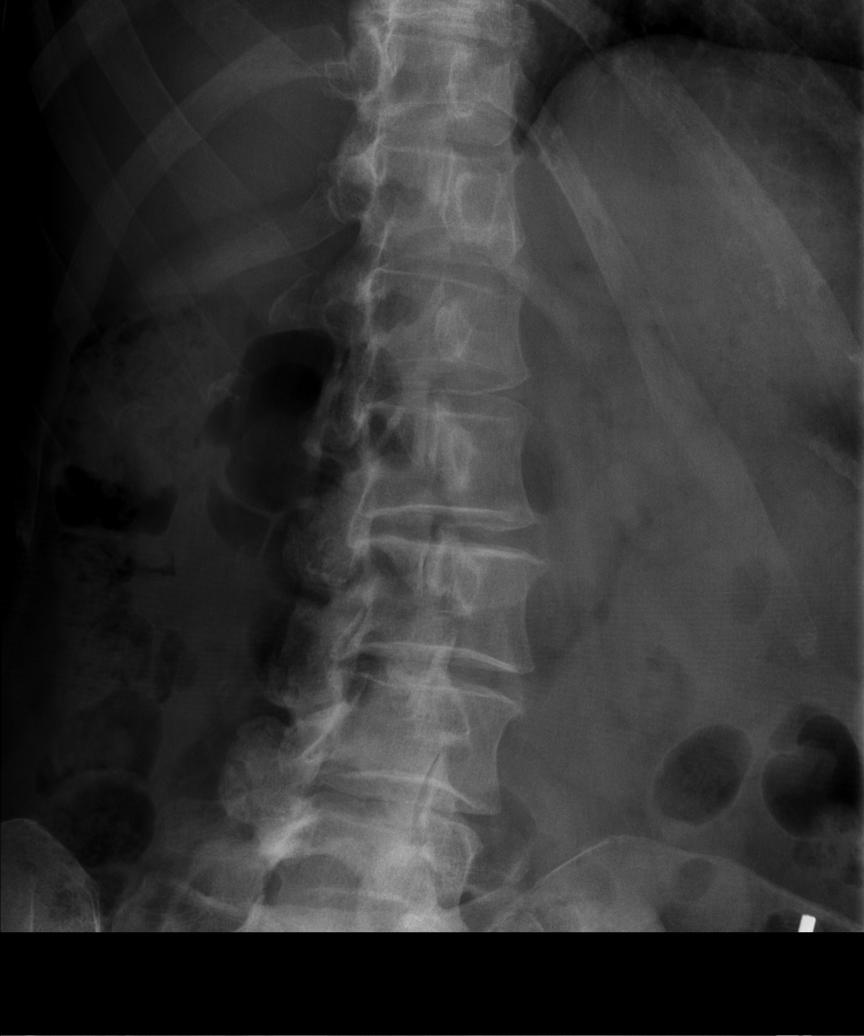

[t l-spine lat]
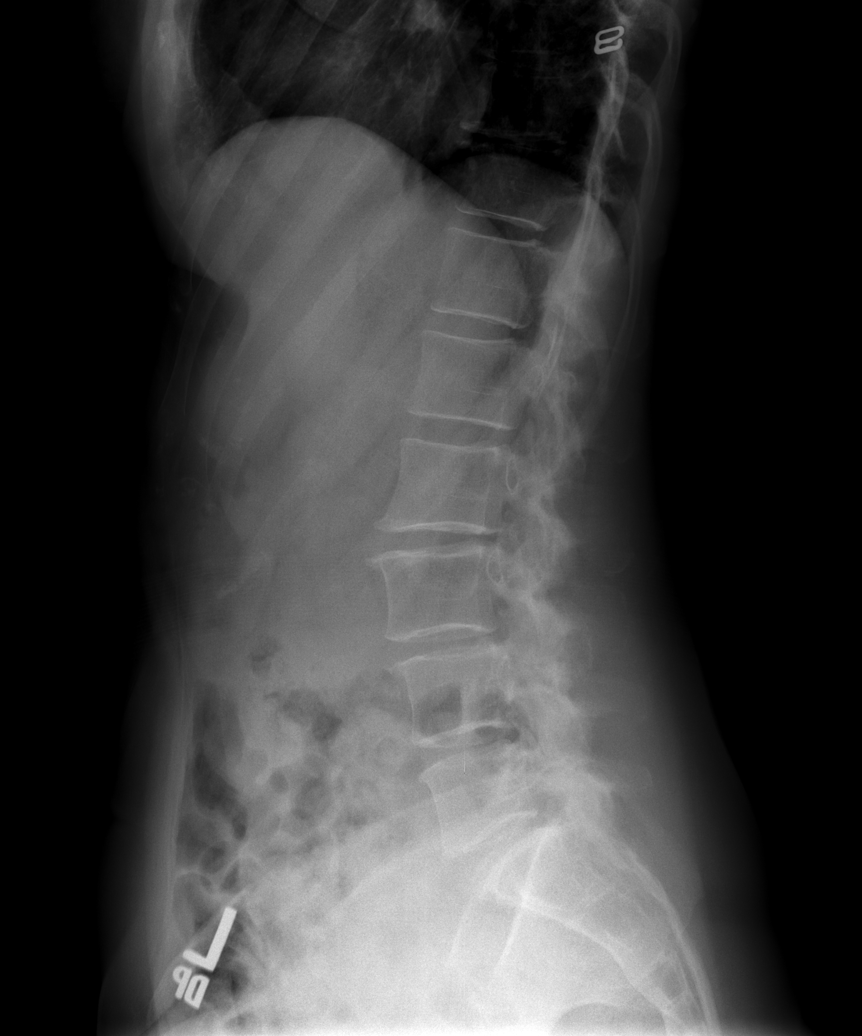

[t l-spine l5-s1 spot]
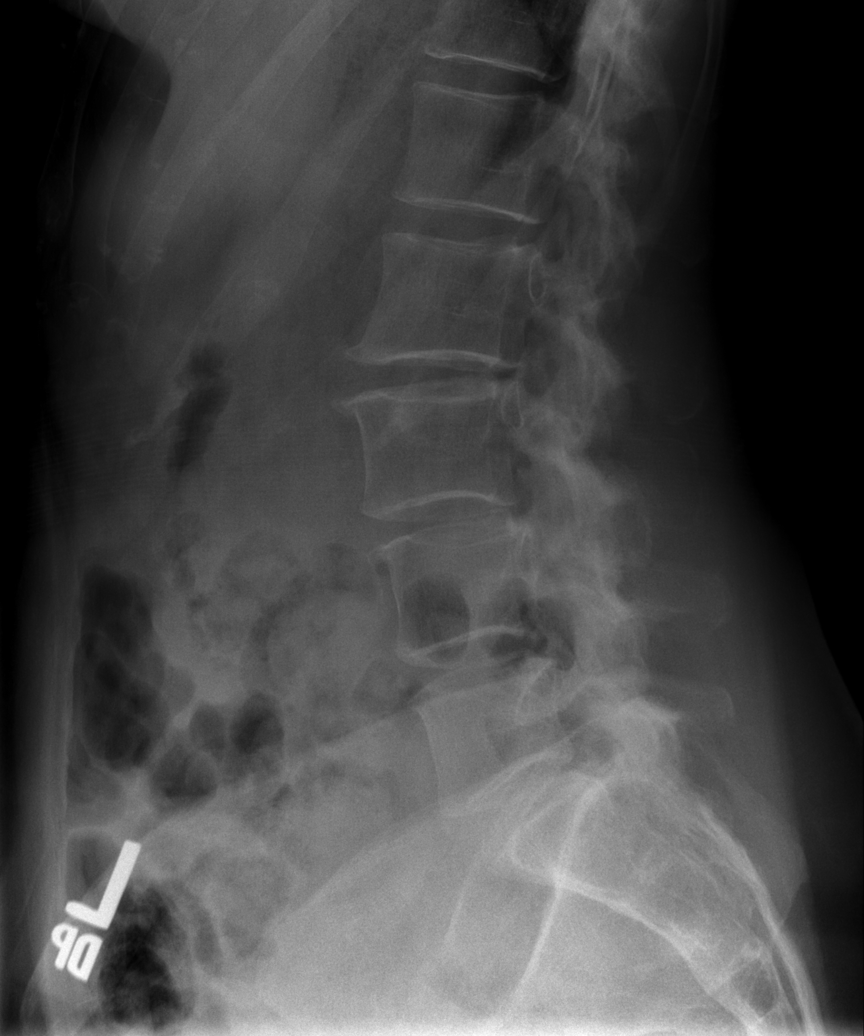

[5 of 5 positions shown; findings below may reference images not displayed]

FINDINGS: Five non-rib-bearing lumbar vertebra.

Slight osseous demineralization.

Multilevel facet degenerative changes and disc space narrowing.

Vertebral body heights maintained without fracture or subluxation.

No bone destruction or spondylolysis.

SI joints preserved.
IMPRESSION: Multilevel degenerative disc and facet disease changes lumbar spine.

## 2022-09-01 IMAGING — CR DG HIP (WITH OR WITHOUT PELVIS) 2-3V*R*
2 series · 2 of 2 positions shown · non-contrast
Comparison: None

CLINICAL DATA: RIGHT hip pain and low back pain, no known injury

EXAM:
DG HIP (WITH OR WITHOUT PELVIS) 2-3V RIGHT

[t hip ap right]
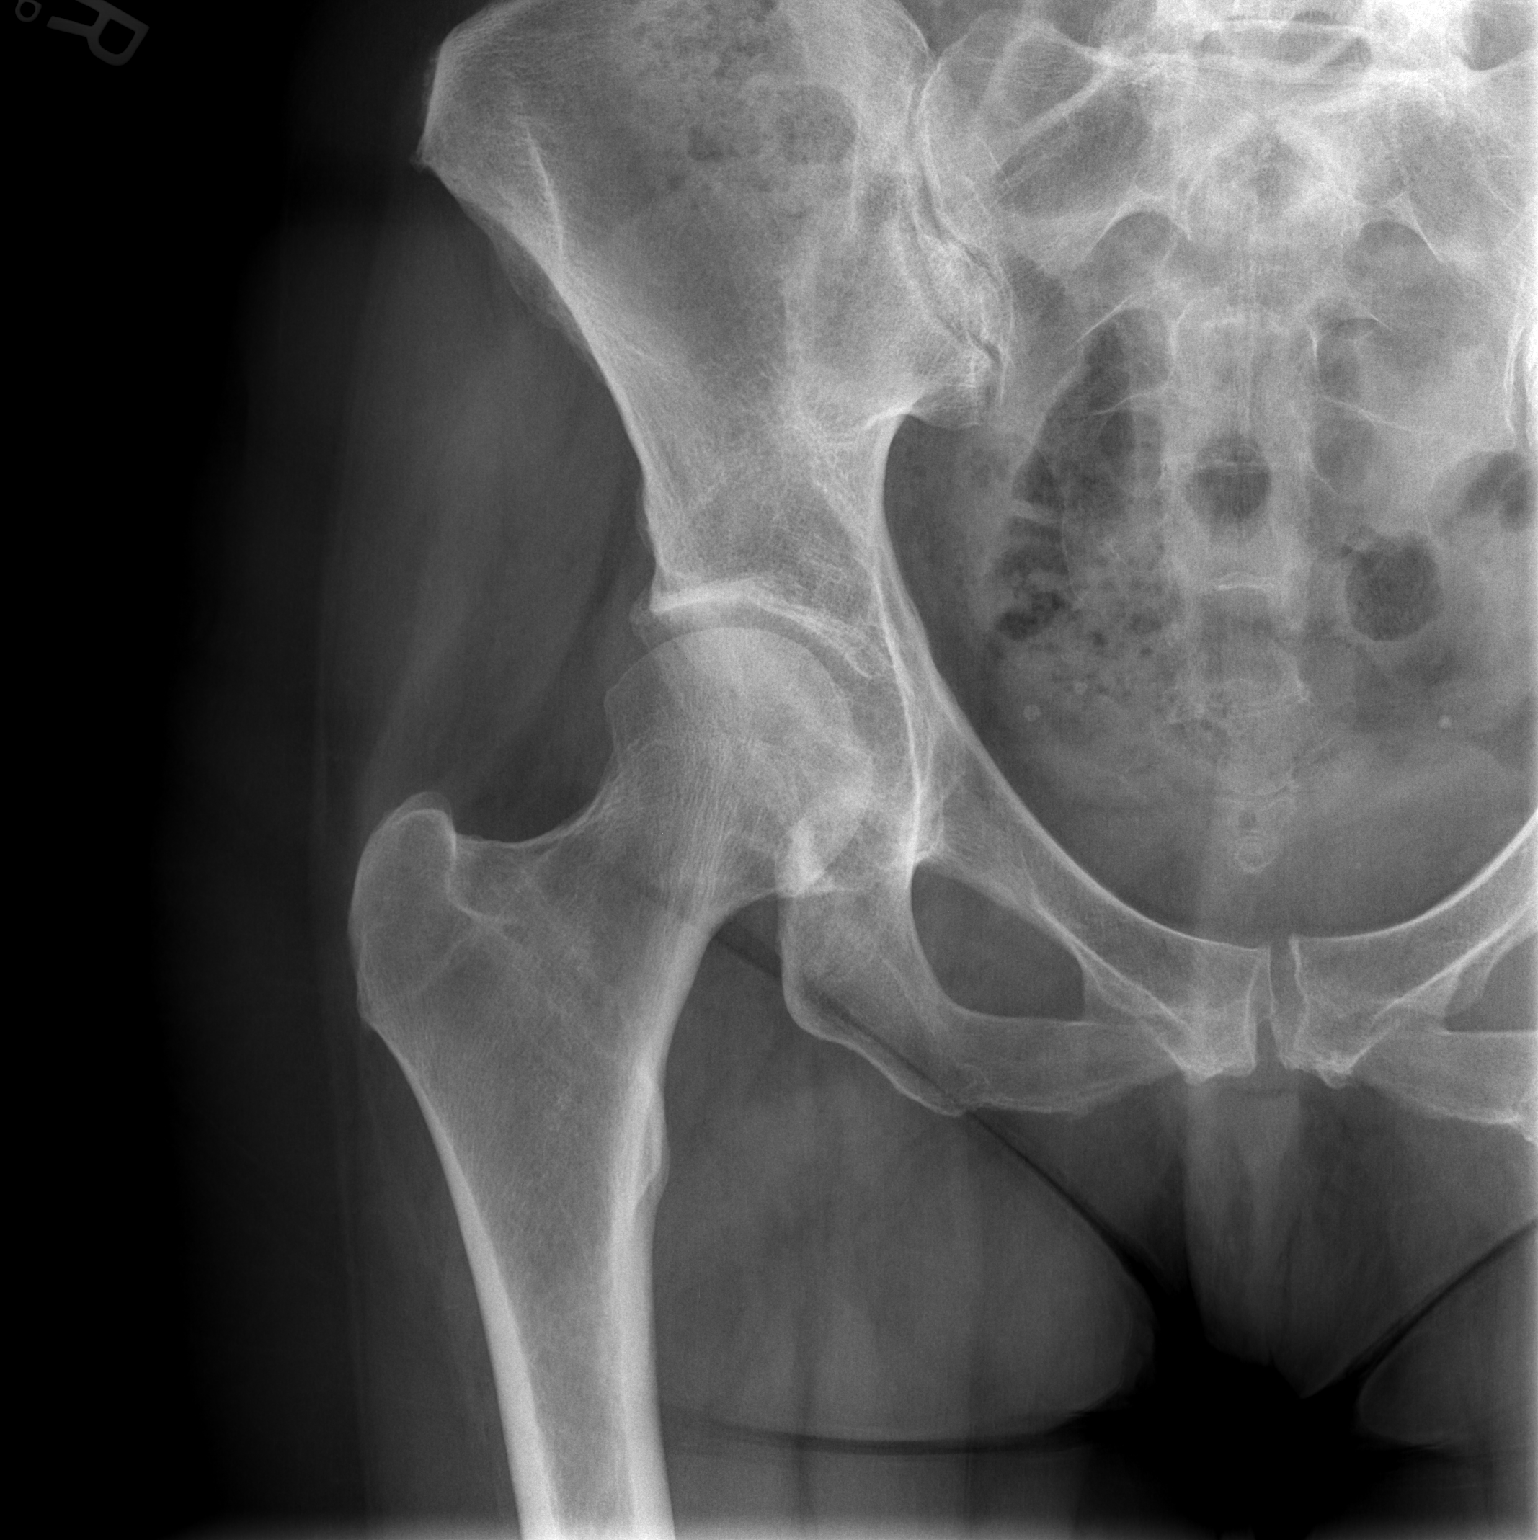

[t hip frog leg right]
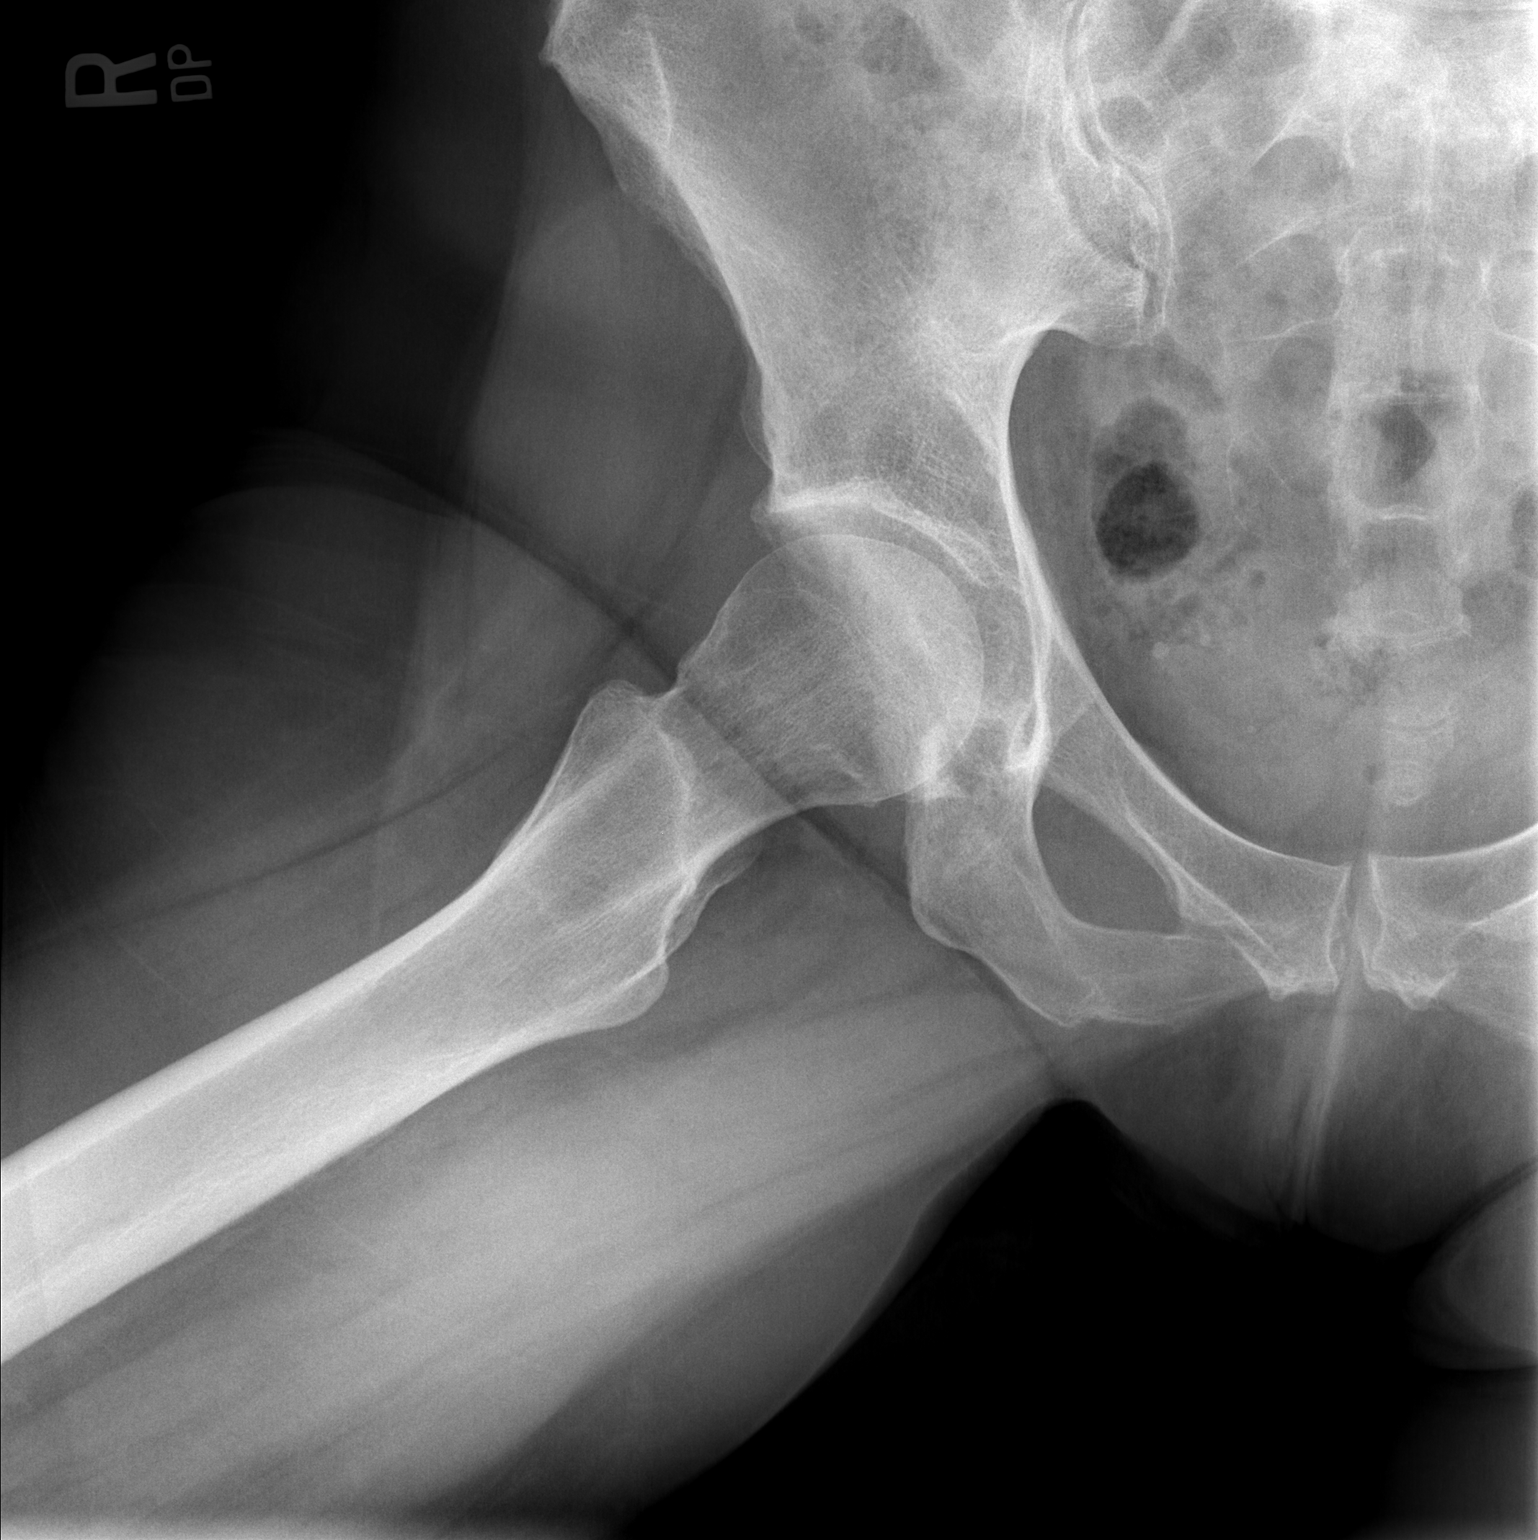

[2 of 2 positions shown; findings below may reference images not displayed]

FINDINGS: Osseous demineralization.

Hip and SI joint spaces preserved.

No acute fracture, dislocation, or bone destruction.
IMPRESSION: No acute abnormalities.

## 2023-08-17 ENCOUNTER — Ambulatory Visit: Payer: Medicare Other | Attending: Cardiovascular Disease | Admitting: Cardiovascular Disease

## 2023-08-17 ENCOUNTER — Encounter: Payer: Self-pay | Admitting: Cardiovascular Disease

## 2023-08-17 VITALS — BP 137/89 | HR 99 | Ht 70.5 in | Wt 172.0 lb

## 2023-08-17 DIAGNOSIS — E7800 Pure hypercholesterolemia, unspecified: Secondary | ICD-10-CM | POA: Diagnosis present

## 2023-08-17 DIAGNOSIS — R002 Palpitations: Secondary | ICD-10-CM | POA: Diagnosis present

## 2023-08-17 NOTE — Progress Notes (Unsigned)
Cardiology Office Note:  .   Date:  08/18/2023  ID:  Sheri Adams, DOB 1956-03-13, MRN [id number] PCP: Noberto Retort, MD  [facility] Providers Cardiologist:  Jodelle Red, MD    History of Present Illness: Sheri Adams is a 67 y.o. female with history of hypercholesterolemia, anxiety, insomnia, presenting with complaints of palpitations.  She describes her palpitations as being a sensation of "jumping" in her chest and they are more frequent when she is very tired or if she has a glass of wine.  They were particular prominent when she had a COVID infection in 2023.  They do not associate the sensation of near syncope, shortness of breath or chest pain, but do cause anxiety.  She saw Dr. Cristal Deer for palpitations in 2021.  Her ECG showed normal sinus rhythm and was a completely normal tracing.  Her symptoms had already improved in the weeks leading up to the appointment so monitoring was not performed.  Her brother had "flutter" and received a pacemaker.  It sounds like he also had heart failure.  She has a history of had a stroke and has diabetes mellitus in her 14s.  Her father had heart disease (coronary artery disease and heart failure) in his 36s and died suddenly at age 25.  She has moderate hypercholesterolemia with a total cholesterol of 241, HDL 66, LDL 159, normal triglycerides.  She does not have diabetes mellitus.  She has never smoked.  Blood pressure is mildly elevated today, but is usually normal.  She admits to being nervous in the doctor's office.  ROS: She denies chest pain at rest or with activity, shortness of breath with activity, orthopnea, PND, lower extremity edema, claudication, focal neurological complaints, dizziness or syncope.  Studies Reviewed: Marland Kitchen   EKG Interpretation Date/Time:  Wednesday August 17 2023 13:42:22 EST Ventricular Rate:  86 PR Interval:  166 QRS Duration:  78 QT Interval:  352 QTC  Calculation: 421 R Axis:   28  Text Interpretation: Normal sinus rhythm Nonspecific ST abnormality When compared with ECG of 04-Jun-2011 07:39, No significant change was found Confirmed by Otilia Kareem [phone]) on 08/17/2023 1:49:31 PM    EKG Interpretation Date/Time:  Wednesday August 17 2023 13:42:22 EST Ventricular Rate:  86 PR Interval:  166 QRS Duration:  78 QT Interval:  352 QTC Calculation: 421 R Axis:   28  Text Interpretation: Normal sinus rhythm Nonspecific ST abnormality When compared with ECG of 04-Jun-2011 07:39, No significant change was found Confirmed by Christeen Lai [phone]) on 08/17/2023 1:49:31 PM       Risk Assessment/Calculations:             Physical Exam:   VS:  BP 137/89   Pulse 99   Ht 5' 10.5" (1.791 m)   Wt 172 lb (78 kg)   SpO2 95%   BMI 24.33 kg/m    Wt Readings from Last 3 Encounters:  08/17/23 172 lb (78 kg)  02/18/20 167 lb (75.8 kg)    GEN: Well nourished, well developed in no acute distress NECK: No JVD; No carotid bruits CARDIAC: RRR, no murmurs, rubs, gallops RESPIRATORY:  Clear to auscultation without rales, wheezing or rhonchi  ABDOMEN: Soft, non-tender, non-distended EXTREMITIES:  No edema; No deformity   ASSESSMENT AND PLAN: .    Palpitations: it sounds like she is describing isolated PACs and PVCs.  The symptoms are quiescent.  She is worried that she may have an arrhythmia like her brother.  Encouraged her to get a personal electronic monitor device such as a smart watch or Kardia device.  She can send me symptomatic recordings through MyChart.  No pharmacological treatment appears to be necessary.  Avoid triggers. Hypercholesterolemia: LDL is fairly high, but she has no history of CAD or PAD or other modifiable risk factors..  Suggested checking a coronary calcium score to better assess her risk of vascular events, especially considering the history of stroke in her sister at a relatively early age and her father's history of  heart disease.     Dispo: Coronary calcium score, check rhythm at home with smart watch or Kardia device and send recordings via MyChart..  Signed, Thurmon Fair, MD

## 2023-08-17 NOTE — Patient Instructions (Signed)
Medication Instructions:  No changes *If you need a refill on your cardiac medications before your next appointment, please call your pharmacy*   Testing/Procedures: Dr Royann Shivers has ordered a CT coronary calcium score.   Test locations:  [facility]  [facility]  This is $99 out of pocket.   Coronary CalciumScan A coronary calcium scan is an imaging test used to look for deposits of calcium and other fatty materials (plaques) in the inner lining of the blood vessels of the heart (coronary arteries). These deposits of calcium and plaques can partly clog and narrow the coronary arteries without producing any symptoms or warning signs. This puts a person at risk for a heart attack. This test can detect these deposits before symptoms develop. Tell a health care provider about: Any allergies you have. All medicines you are taking, including vitamins, herbs, eye drops, creams, and over-the-counter medicines. Any problems you or family members have had with anesthetic medicines. Any blood disorders you have. Any surgeries you have had. Any medical conditions you have. Whether you are pregnant or may be pregnant. What are the risks? Generally, this is a safe procedure. However, problems may occur, including: Harm to a pregnant woman and her unborn baby. This test involves the use of radiation. Radiation exposure can be dangerous to a pregnant woman and her unborn baby. If you are pregnant, you generally should not have this procedure done. Slight increase in the risk of cancer. This is because of the radiation involved in the test. What happens before the procedure? No preparation is needed for this procedure. What happens during the procedure? You will undress and remove any jewelry around your neck or chest. You will put on a hospital gown. Sticky electrodes will be placed on your chest. The  electrodes will be connected to an electrocardiogram (ECG) machine to record a tracing of the electrical activity of your heart. A CT scanner will take pictures of your heart. During this time, you will be asked to lie still and hold your breath for 2-3 seconds while a picture of your heart is being taken. The procedure may vary among health care providers and hospitals. What happens after the procedure? You can get dressed. You can return to your normal activities. It is up to you to get the results of your test. Ask your health care provider, or the department that is doing the test, when your results will be ready. Summary A coronary calcium scan is an imaging test used to look for deposits of calcium and other fatty materials (plaques) in the inner lining of the blood vessels of the heart (coronary arteries). Generally, this is a safe procedure. Tell your health care provider if you are pregnant or may be pregnant. No preparation is needed for this procedure. A CT scanner will take pictures of your heart. You can return to your normal activities after the scan is done. This information is not intended to replace advice given to you by your health care provider. Make sure you discuss any questions you have with your health care provider. Document Released: 03/25/2008 Document Revised: 08/16/2016 Document Reviewed: 08/16/2016 Elsevier Interactive Patient Education  2017 [employer].   You can look into the [employer] device by [employer]. This device is purchased by you and it connects to an application you download to your smart phone.  It can detect abnormal heart rhythms and alert you to contact your doctor  for further evaluation. The web site is:  https://www.alivecor.com     Follow-Up: At [facility], you and your health needs are our priority.  As part of our continuing mission to provide you with exceptional heart care, we have created designated Provider Care Teams.   These Care Teams include your primary Cardiologist (physician) and Advanced Practice Providers (APPs -  Physician Assistants and Nurse Practitioners) who all work together to provide you with the care you need, when you need it.  We recommend signing up for the patient portal called "MyChart".  Sign up information is provided on this After Visit Summary.  MyChart is used to connect with patients for Virtual Visits (Telemedicine).  Patients are able to view lab/test results, encounter notes, upcoming appointments, etc.  Non-urgent messages can be sent to your provider as well.   To learn more about what you can do with MyChart, go to [email or web address].    Your next appointment:    Follow up as needed  Provider:   Dr Royann Shivers

## 2023-08-18 ENCOUNTER — Encounter: Payer: Self-pay | Admitting: Cardiovascular Disease

## 2023-09-29 ENCOUNTER — Ambulatory Visit (HOSPITAL_BASED_OUTPATIENT_CLINIC_OR_DEPARTMENT_OTHER)
Admission: RE | Admit: 2023-09-29 | Discharge: 2023-09-29 | Disposition: A | Discharge status: Home / Self Care | Payer: Self-pay | Source: Ambulatory Visit | Attending: Cardiovascular Disease | Admitting: Cardiovascular Disease

## 2023-09-29 DIAGNOSIS — E7800 Pure hypercholesterolemia, unspecified: Secondary | ICD-10-CM

## 2023-10-03 ENCOUNTER — Telehealth: Payer: Self-pay | Admitting: Emergency Medicine

## 2023-10-03 NOTE — Telephone Encounter (Signed)
Excellent news on the coronary calcium score.  There is no visible calcified coronary plaque and this statistically means that the likelihood of a heart attack or other serious vascular complication in the next several years is very low.  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  Based on that and the family history of early cardiac problems I would still recommend taking medication to bring the LDL cholesterol to less than 100. If she agrees, would recommend starting rosuvastatin 10 mg once daily and rechecking a lipid profile in 3 months.    Called to go over the information above with the patient and see if she would like to start on the Rosuvastatin. No answer, left message and call back number.

## 2023-10-06 ENCOUNTER — Encounter: Payer: Self-pay | Admitting: Cardiovascular Disease
